# Patient Record
Sex: Male | Born: 1980
Health system: Southern US, Community
[De-identification: ages and names within clinical notes are randomized; demographics above are authoritative.]

## PROBLEM LIST (undated history)

## (undated) HISTORY — PX: HIP SURGERY: SHX245

---

## 2003-03-01 ENCOUNTER — Emergency Department (HOSPITAL_COMMUNITY): Admission: EM | Admit: 2003-03-01 | Discharge: 2003-03-01 | Payer: Self-pay | Admitting: Emergency Medicine

## 2015-03-13 ENCOUNTER — Emergency Department (HOSPITAL_COMMUNITY)
Admission: EM | Admit: 2015-03-13 | Discharge: 2015-03-13 | Disposition: A | Discharge status: Home / Self Care | Payer: BLUE CROSS/BLUE SHIELD | Source: Home / Self Care | Attending: Family Medicine | Admitting: Family Medicine

## 2015-03-13 ENCOUNTER — Emergency Department (HOSPITAL_COMMUNITY)
Admission: EM | Admit: 2015-03-13 | Discharge: 2015-03-13 | Discharge status: Absent without leave | Payer: Self-pay | Source: Home / Self Care

## 2015-03-13 ENCOUNTER — Encounter (HOSPITAL_COMMUNITY): Payer: Self-pay | Admitting: *Deleted

## 2015-03-13 DIAGNOSIS — S81812A Laceration without foreign body, left lower leg, initial encounter: Secondary | ICD-10-CM

## 2015-03-13 NOTE — Discharge Instructions (Signed)
Your cut was repaired using absorbable deep stitches and the superficial stitches that will need to be removed in 14 days. Please wash the area with warm soapy water every day and a plan about appointment. Please remember that the area will be most vulnerable to reinjury from 2-3 weeks. Scar tissue will fully form in that area in 6 weeks. Memory to decrease sun exposure to the area and consider using a vitamin E lotion over the next several months in order to soften the scar. If you develop any signs of infection such as swelling increasing tenderness or discharge please call us immediately. Memory to minimize the amount of tension you apply to that area and minimize your contact over the next 3 weeks.

## 2015-03-13 NOTE — ED Notes (Addendum)
Pt  Sustained  A  laceration    To  His  l  Leg in the  Area  Of the  Knee   Bleeding  Has  Subsided              He  Ambulated  To room  With a  Steady  Fluid  Gait  Pt  States  He  Snagged  A  Metal  Bolt   When he  Was  Climbing a  Ladder    During  training

## 2015-03-13 NOTE — ED Provider Notes (Signed)
CSN: [id number]     Arrival date & time 03/13/15  1015 History   None    Chief Complaint  Patient presents with  . Laceration   (Consider location/radiation/quality/duration/timing/severity/associated sxs/prior Treatment) HPI  L distal leg. Medial aspect. Jumping a 4 foot fence during training for [employer] and leg caugth a metal bolt. Immediately painful. No radiation of pain. Immediately started to bleed and aplied pressure w/ relief. Last tetanus 06/2014. No loss of function or sensation down leg.  Denies headache, lightheadedness, syncope, vomiting, chest pain, palpitations, shortness of breath, LOC, fevers, nausea, vomiting, leg stiffness, leg weakness.  History reviewed. No pertinent past medical history. Past Surgical History  Procedure Laterality Date  . Hip surgery      L labral tear   Family History  Problem Relation Age of Onset  . Hypertension Mother   . Rheum arthritis Mother   . Hypertension Father    History  Substance Use Topics  . Smoking status: Not on file  . Smokeless tobacco: Not on file  . Alcohol Use: No    Review of Systems Per HPI with all other pertinent systems negative.   Allergies  Review of patient's allergies indicates no known allergies.  Home Medications   Prior to Admission medications   Not on File   BP 141/84 mmHg  Pulse 76  Temp(Src) 98.8 F (37.1 C) (Oral)  Resp 16  SpO2 99% Physical Exam  Physical Exam  Constitutional: oriented to person, place, and time. appears well-developed and well-nourished. No distress.  HENT:  Head: Normocephalic and atraumatic.  Eyes: EOMI. PERRL.  Neck: Normal range of motion.  Cardiovascular: RRR, no m/r/g, 2+ distal pulses,  Pulmonary/Chest: Effort normal and breath sounds normal. No respiratory distress.  Abdominal: Soft. Bowel sounds are normal. NonTTP, no distension.  Musculoskeletal: Normal range of motion. Non ttp, no effusion.  Neurological: alert and oriented to person, place, and time.   Skin: Left distal medial thigh with tearing type laceration. Repair as below.  Psychiatric: normal mood and affect. behavior is normal. Judgment and thought content normal.      ED Course  LACERATION REPAIR Date/Time: 03/13/2015 12:44 PM Performed by: Konrad DoloresMERRELL, Devora Tortorella J Authorized by: Konrad DoloresMERRELL, Joakim Huesman J Consent: Verbal consent obtained. Risks and benefits: risks, benefits and alternatives were discussed Consent given by: patient Patient identity confirmed: verbally with patient Location: L distal medial thigh. Laceration length: 1.7 cm Tendon involvement: none Nerve involvement: none Vascular damage: no Anesthesia: local infiltration Local anesthetic: lidocaine 2% with epinephrine Anesthetic total: 4 ml Irrigation solution: Betadine and saline. Amount of cleaning: extensive Debridement: none Degree of undermining: none Skin closure: 3-0 nylon Subcutaneous closure: 3-0 Vicryl Number of sutures: 3 Vicryl and 5 nylon. Technique: simple Approximation: close Approximation difficulty: complex Patient tolerance: Patient tolerated the procedure well with no immediate complications   (including critical care time) Labs Review Labs Reviewed - No data to display  Imaging Review No results found.   MDM   1. Laceration of left leg, initial encounter    Tearing laceration repaired as described above. Patient tolerated the procedure well. Patient young healthy with excellent vascular flow to the area. Area was well cleaned and there is no need for anabolic so this point time. Very detailed wound care instructions were given to the patient. In short patient's sutures will come out in 2 weeks. Patient can return to full activity after 6 weeks and limited activity after 3 weeks in order to assure that the area does not dehisced.  Patient to call if develops any warning signs or symptoms of infection and will need antibiotics at that point time.     Ozella Rocks, MD 03/13/15 1247

## 2018-12-29 DIAGNOSIS — Z0000 Encounter for general adult medical examination without abnormal findings: Secondary | ICD-10-CM | POA: Diagnosis not present

## 2018-12-29 DIAGNOSIS — E7800 Pure hypercholesterolemia, unspecified: Secondary | ICD-10-CM | POA: Diagnosis not present

## 2018-12-29 DIAGNOSIS — Z114 Encounter for screening for human immunodeficiency virus [HIV]: Secondary | ICD-10-CM | POA: Diagnosis not present

## 2018-12-29 DIAGNOSIS — M791 Myalgia, unspecified site: Secondary | ICD-10-CM | POA: Diagnosis not present

## 2018-12-29 DIAGNOSIS — Z23 Encounter for immunization: Secondary | ICD-10-CM | POA: Diagnosis not present

## 2018-12-29 DIAGNOSIS — E559 Vitamin D deficiency, unspecified: Secondary | ICD-10-CM | POA: Diagnosis not present

## 2018-12-29 DIAGNOSIS — R5383 Other fatigue: Secondary | ICD-10-CM | POA: Diagnosis not present

## 2019-01-19 DIAGNOSIS — E559 Vitamin D deficiency, unspecified: Secondary | ICD-10-CM | POA: Diagnosis not present

## 2019-01-19 DIAGNOSIS — E7800 Pure hypercholesterolemia, unspecified: Secondary | ICD-10-CM | POA: Diagnosis not present

## 2019-01-19 DIAGNOSIS — Z711 Person with feared health complaint in whom no diagnosis is made: Secondary | ICD-10-CM | POA: Diagnosis not present

## 2019-03-01 DIAGNOSIS — H9313 Tinnitus, bilateral: Secondary | ICD-10-CM | POA: Diagnosis not present

## 2019-05-27 DIAGNOSIS — H9313 Tinnitus, bilateral: Secondary | ICD-10-CM | POA: Diagnosis not present

## 2019-05-27 DIAGNOSIS — H833X3 Noise effects on inner ear, bilateral: Secondary | ICD-10-CM | POA: Diagnosis not present

## 2019-06-09 DIAGNOSIS — H9313 Tinnitus, bilateral: Secondary | ICD-10-CM | POA: Diagnosis not present

## 2019-09-27 DIAGNOSIS — E7800 Pure hypercholesterolemia, unspecified: Secondary | ICD-10-CM | POA: Diagnosis not present

## 2019-09-27 DIAGNOSIS — Z1159 Encounter for screening for other viral diseases: Secondary | ICD-10-CM | POA: Diagnosis not present

## 2019-09-27 DIAGNOSIS — Z23 Encounter for immunization: Secondary | ICD-10-CM | POA: Diagnosis not present

## 2019-09-27 DIAGNOSIS — E559 Vitamin D deficiency, unspecified: Secondary | ICD-10-CM | POA: Diagnosis not present

## 2019-09-27 DIAGNOSIS — Z79899 Other long term (current) drug therapy: Secondary | ICD-10-CM | POA: Diagnosis not present

## 2020-01-07 DIAGNOSIS — M25572 Pain in left ankle and joints of left foot: Secondary | ICD-10-CM | POA: Diagnosis not present

## 2020-01-07 DIAGNOSIS — S93409A Sprain of unspecified ligament of unspecified ankle, initial encounter: Secondary | ICD-10-CM | POA: Diagnosis not present

## 2020-01-07 DIAGNOSIS — I10 Essential (primary) hypertension: Secondary | ICD-10-CM | POA: Diagnosis not present

## 2020-01-28 DIAGNOSIS — K219 Gastro-esophageal reflux disease without esophagitis: Secondary | ICD-10-CM | POA: Diagnosis not present

## 2020-01-28 DIAGNOSIS — Z0000 Encounter for general adult medical examination without abnormal findings: Secondary | ICD-10-CM | POA: Diagnosis not present

## 2020-01-28 DIAGNOSIS — E7800 Pure hypercholesterolemia, unspecified: Secondary | ICD-10-CM | POA: Diagnosis not present

## 2020-01-28 DIAGNOSIS — R079 Chest pain, unspecified: Secondary | ICD-10-CM | POA: Diagnosis not present

## 2020-01-28 DIAGNOSIS — R030 Elevated blood-pressure reading, without diagnosis of hypertension: Secondary | ICD-10-CM | POA: Diagnosis not present

## 2020-03-09 DIAGNOSIS — I10 Essential (primary) hypertension: Secondary | ICD-10-CM | POA: Diagnosis not present

## 2020-03-09 DIAGNOSIS — Z8249 Family history of ischemic heart disease and other diseases of the circulatory system: Secondary | ICD-10-CM | POA: Diagnosis not present

## 2020-03-09 DIAGNOSIS — R030 Elevated blood-pressure reading, without diagnosis of hypertension: Secondary | ICD-10-CM | POA: Diagnosis not present

## 2020-03-20 DIAGNOSIS — I10 Essential (primary) hypertension: Secondary | ICD-10-CM | POA: Diagnosis not present

## 2020-08-01 DIAGNOSIS — E7800 Pure hypercholesterolemia, unspecified: Secondary | ICD-10-CM | POA: Diagnosis not present

## 2020-08-01 DIAGNOSIS — I10 Essential (primary) hypertension: Secondary | ICD-10-CM | POA: Diagnosis not present

## 2020-12-06 DIAGNOSIS — Z03818 Encounter for observation for suspected exposure to other biological agents ruled out: Secondary | ICD-10-CM | POA: Diagnosis not present

## 2020-12-06 DIAGNOSIS — Z20822 Contact with and (suspected) exposure to covid-19: Secondary | ICD-10-CM | POA: Diagnosis not present

## 2020-12-21 DIAGNOSIS — R0683 Snoring: Secondary | ICD-10-CM | POA: Diagnosis not present

## 2020-12-21 DIAGNOSIS — I10 Essential (primary) hypertension: Secondary | ICD-10-CM | POA: Diagnosis not present

## 2020-12-21 DIAGNOSIS — M79604 Pain in right leg: Secondary | ICD-10-CM | POA: Diagnosis not present

## 2021-01-01 DIAGNOSIS — G4719 Other hypersomnia: Secondary | ICD-10-CM | POA: Diagnosis not present

## 2021-01-01 DIAGNOSIS — E7800 Pure hypercholesterolemia, unspecified: Secondary | ICD-10-CM | POA: Diagnosis not present

## 2021-01-01 DIAGNOSIS — I10 Essential (primary) hypertension: Secondary | ICD-10-CM | POA: Diagnosis not present

## 2021-01-01 DIAGNOSIS — G4721 Circadian rhythm sleep disorder, delayed sleep phase type: Secondary | ICD-10-CM | POA: Diagnosis not present

## 2021-01-05 DIAGNOSIS — M79604 Pain in right leg: Secondary | ICD-10-CM | POA: Diagnosis not present

## 2021-01-12 DIAGNOSIS — G4733 Obstructive sleep apnea (adult) (pediatric): Secondary | ICD-10-CM | POA: Diagnosis not present

## 2021-01-12 DIAGNOSIS — E669 Obesity, unspecified: Secondary | ICD-10-CM | POA: Diagnosis not present

## 2021-01-12 DIAGNOSIS — I10 Essential (primary) hypertension: Secondary | ICD-10-CM | POA: Diagnosis not present

## 2021-01-30 DIAGNOSIS — G4733 Obstructive sleep apnea (adult) (pediatric): Secondary | ICD-10-CM | POA: Diagnosis not present

## 2021-01-31 DIAGNOSIS — Z0000 Encounter for general adult medical examination without abnormal findings: Secondary | ICD-10-CM | POA: Diagnosis not present

## 2021-01-31 DIAGNOSIS — E7800 Pure hypercholesterolemia, unspecified: Secondary | ICD-10-CM | POA: Diagnosis not present

## 2021-01-31 DIAGNOSIS — I10 Essential (primary) hypertension: Secondary | ICD-10-CM | POA: Diagnosis not present

## 2021-02-01 ENCOUNTER — Other Ambulatory Visit (HOSPITAL_COMMUNITY): Payer: Self-pay | Admitting: Family Medicine

## 2021-02-01 ENCOUNTER — Other Ambulatory Visit: Payer: Self-pay | Admitting: Family Medicine

## 2021-02-01 DIAGNOSIS — Z8249 Family history of ischemic heart disease and other diseases of the circulatory system: Secondary | ICD-10-CM

## 2021-02-02 DIAGNOSIS — M79604 Pain in right leg: Secondary | ICD-10-CM | POA: Diagnosis not present

## 2021-02-06 ENCOUNTER — Other Ambulatory Visit: Payer: Self-pay | Admitting: Family Medicine

## 2021-02-06 DIAGNOSIS — Z8249 Family history of ischemic heart disease and other diseases of the circulatory system: Secondary | ICD-10-CM

## 2021-02-06 DIAGNOSIS — M79604 Pain in right leg: Secondary | ICD-10-CM

## 2021-02-07 ENCOUNTER — Other Ambulatory Visit: Payer: Self-pay

## 2021-02-07 ENCOUNTER — Ambulatory Visit (HOSPITAL_COMMUNITY)
Admission: RE | Admit: 2021-02-07 | Discharge: 2021-02-07 | Disposition: A | Discharge status: Home / Self Care | Payer: BLUE CROSS/BLUE SHIELD | Source: Ambulatory Visit | Attending: Family Medicine | Admitting: Family Medicine

## 2021-02-07 DIAGNOSIS — Z8249 Family history of ischemic heart disease and other diseases of the circulatory system: Secondary | ICD-10-CM | POA: Insufficient documentation

## 2021-02-08 ENCOUNTER — Ambulatory Visit
Admission: RE | Admit: 2021-02-08 | Discharge: 2021-02-08 | Disposition: A | Discharge status: Home / Self Care | Payer: BC Managed Care – PPO | Source: Ambulatory Visit | Attending: Family Medicine | Admitting: Family Medicine

## 2021-02-08 DIAGNOSIS — Z8249 Family history of ischemic heart disease and other diseases of the circulatory system: Secondary | ICD-10-CM

## 2021-02-08 DIAGNOSIS — M79604 Pain in right leg: Secondary | ICD-10-CM

## 2021-02-19 DIAGNOSIS — M25561 Pain in right knee: Secondary | ICD-10-CM | POA: Diagnosis not present

## 2021-02-20 DIAGNOSIS — G4733 Obstructive sleep apnea (adult) (pediatric): Secondary | ICD-10-CM | POA: Diagnosis not present

## 2021-03-23 DIAGNOSIS — G4733 Obstructive sleep apnea (adult) (pediatric): Secondary | ICD-10-CM | POA: Diagnosis not present

## 2021-04-10 DIAGNOSIS — G4733 Obstructive sleep apnea (adult) (pediatric): Secondary | ICD-10-CM | POA: Diagnosis not present

## 2021-04-10 DIAGNOSIS — F5112 Insufficient sleep syndrome: Secondary | ICD-10-CM | POA: Diagnosis not present

## 2021-04-10 DIAGNOSIS — I10 Essential (primary) hypertension: Secondary | ICD-10-CM | POA: Diagnosis not present

## 2021-04-22 DIAGNOSIS — G4733 Obstructive sleep apnea (adult) (pediatric): Secondary | ICD-10-CM | POA: Diagnosis not present

## 2021-05-01 DIAGNOSIS — G4733 Obstructive sleep apnea (adult) (pediatric): Secondary | ICD-10-CM | POA: Diagnosis not present

## 2021-05-23 DIAGNOSIS — G4733 Obstructive sleep apnea (adult) (pediatric): Secondary | ICD-10-CM | POA: Diagnosis not present

## 2022-09-28 IMAGING — CT CT CARDIAC CORONARY ARTERY CALCIUM SCORE
2 series · 16 of 20 positions shown, 18 images · non-contrast
Comparison: None.
COMPARISON: None.

Addendum:
EXAM:
OVER-READ INTERPRETATION  CT CHEST

The following report is an over-read performed by radiologist Dr.
Romanas Kirda [REDACTED] on 02/07/2021. This
over-read does not include interpretation of cardiac or coronary
anatomy or pathology. The coronary calcium score interpretation by
the cardiologist is attached.
CLINICAL DATA: Risk stratification: 39 Year-old White Male
Coronary Calcium Score
TECHNIQUE: The patient was scanned on a Siemens Force scanner. Axial
non-contrast 3 mm slices were carried out through the heart. The
data set was analyzed on a dedicated work station and scored using
the Agatson method.

[Series 3: cascseq 2.0 b35f 70% · axial · 0.39mm/px · z∈[+1205,+1311]mm · 8 of 69 slices shown]
[im 8/69  vessel]
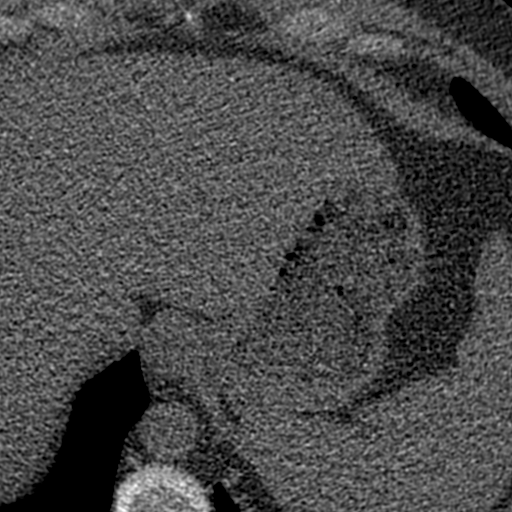
[im 16/69  vessel]
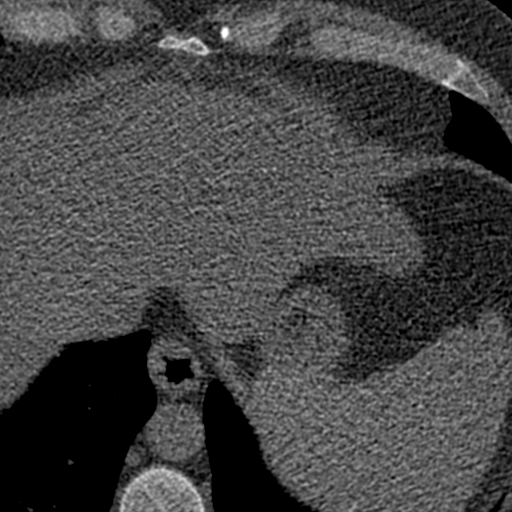
[im 23/69  vessel]
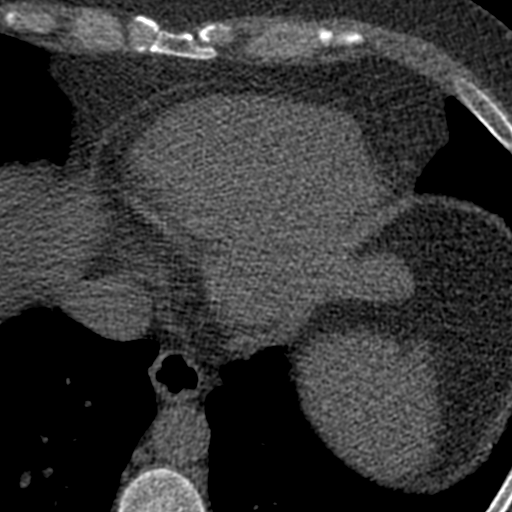
[im 31/69  vessel]
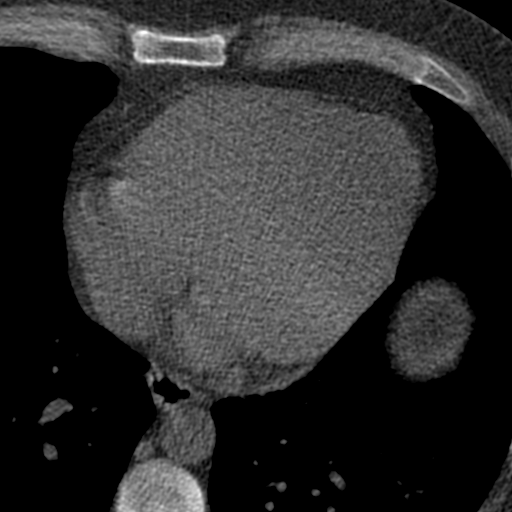
[im 38/69  vessel]
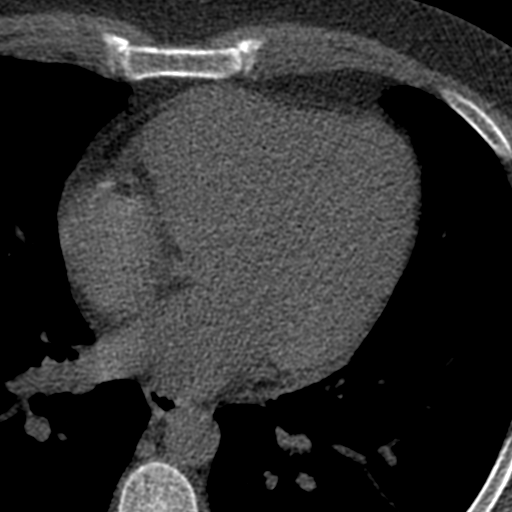
[im 46/69  vessel]
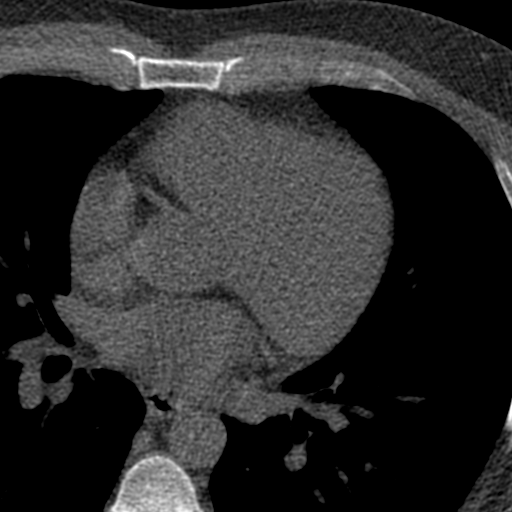
[im 53/69  vessel]
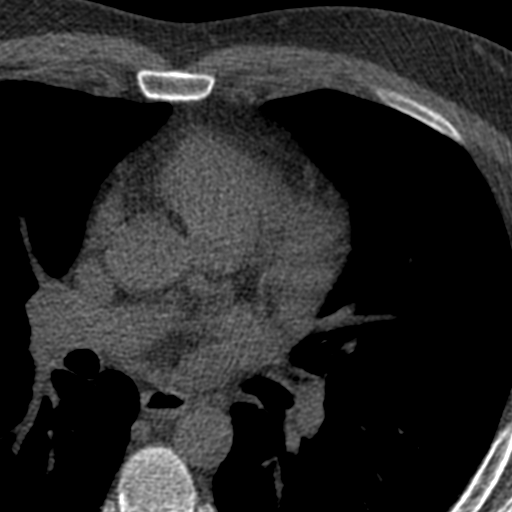
[im 61/69  vessel]
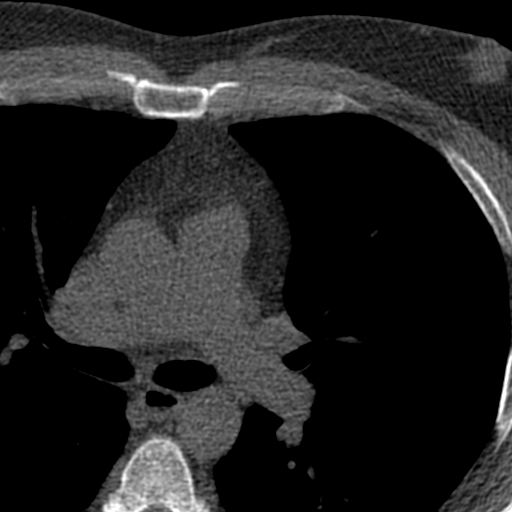

[Series 4: ax st full fov · axial · 0.87mm/px · z∈[+1205,+1311]mm · 8 of 69 slices shown, 10 images]
[im 8/69  vessel]
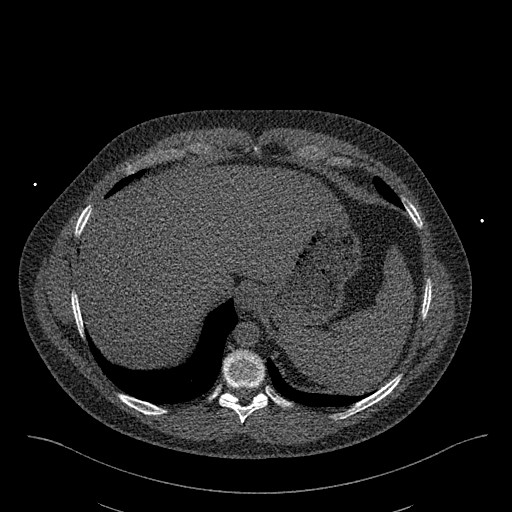
[im 8/69  lung]
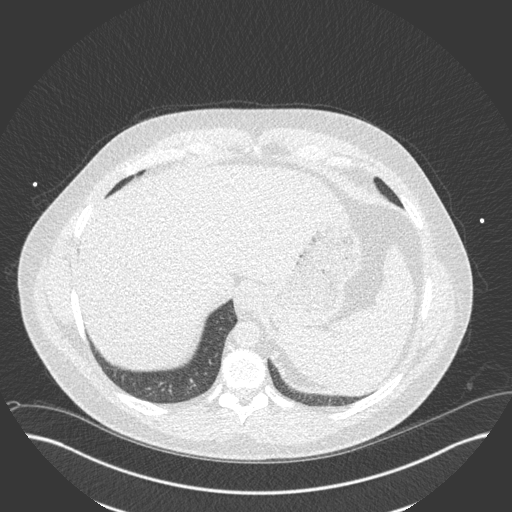
[im 16/69  vessel]
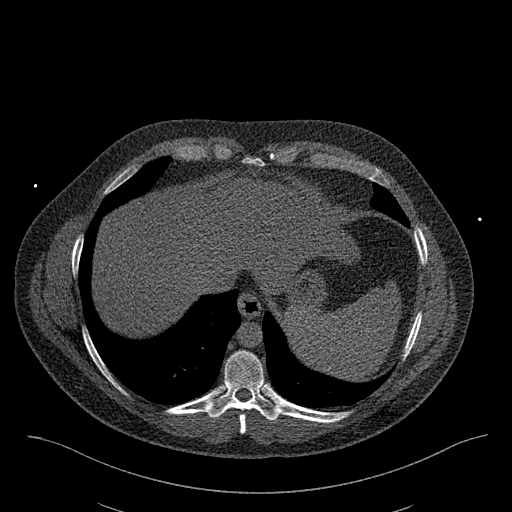
[im 23/69  vessel]
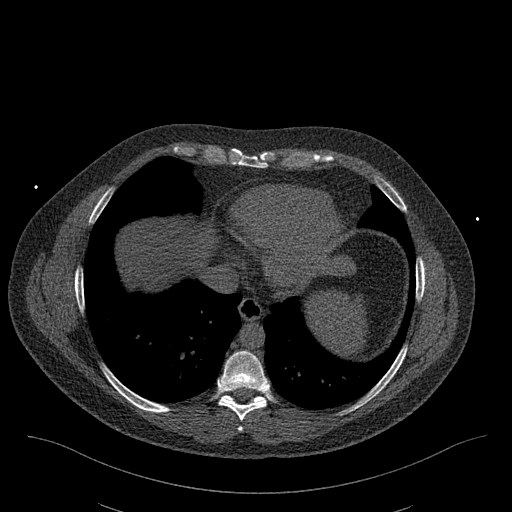
[im 31/69  vessel]
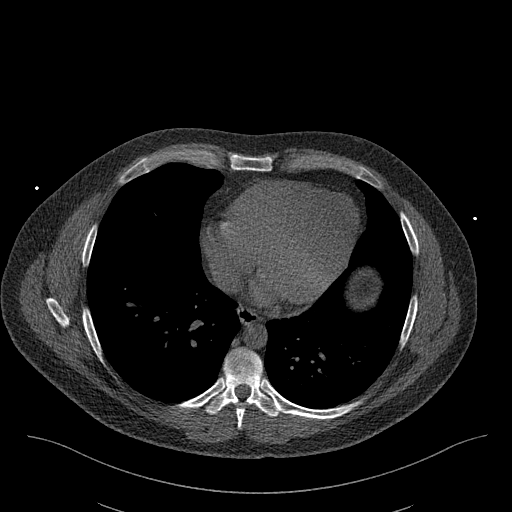
[im 38/69  vessel]
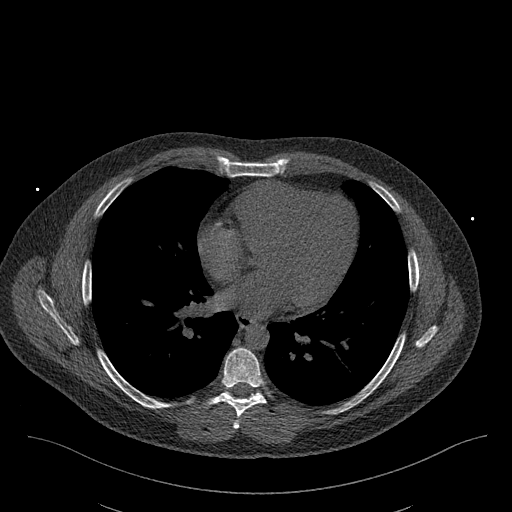
[im 38/69  lung]
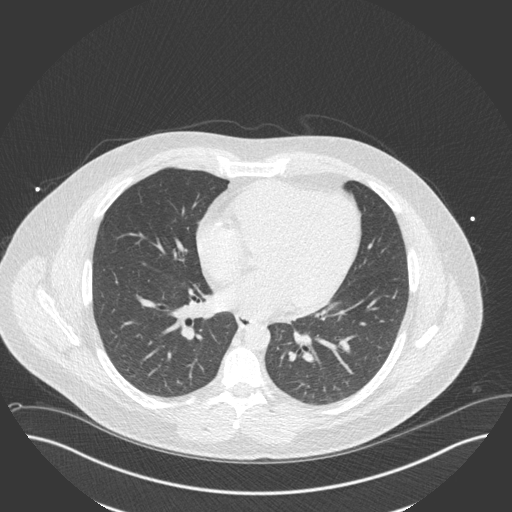
[im 46/69  vessel]
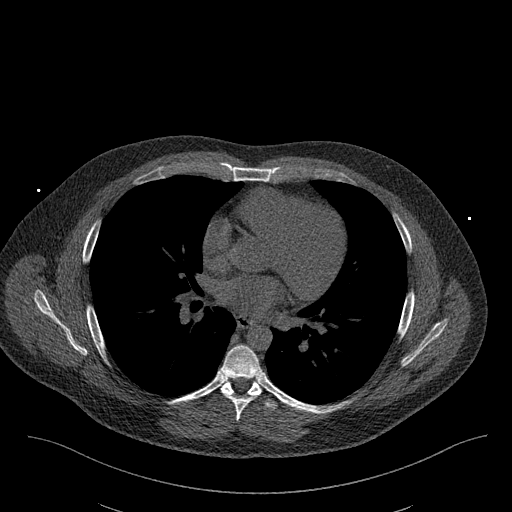
[im 53/69  vessel]
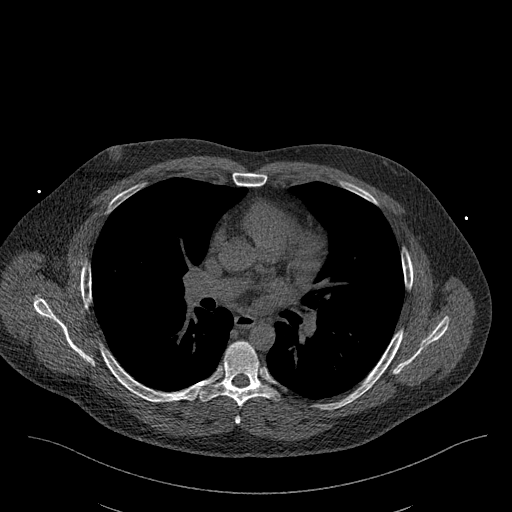
[im 61/69  vessel]
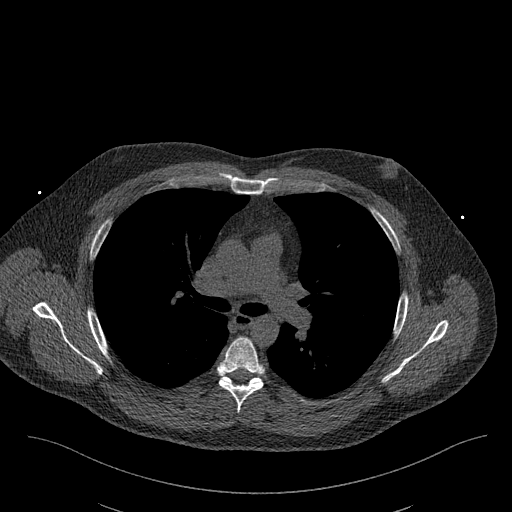

[16 of 20 positions shown; findings below may reference images not displayed]

FINDINGS: Within the visualized portions of the thorax there are no suspicious
appearing pulmonary nodules or masses, there is no acute
consolidative airspace disease, no pleural effusions, no
pneumothorax and no lymphadenopathy. Visualized portions of the
upper abdomen demonstrates mild diffuse low attenuation throughout
the visualized hepatic parenchyma. There are no aggressive appearing
lytic or blastic lesions noted in the visualized portions of the
skeleton.
IMPRESSION: 1. Hepatic steatosis.
FINDINGS: Non-cardiac: See separate report from [REDACTED].

Ascending Aorta: Normal caliber.

Pericardium: Normal.

Coronary arteries: Normal origins.

Coronary calcium score of 0. This was 1st percentile for age,
gender, and race matched controls.
IMPRESSION: 1. Coronary calcium score of 0. This was 1st percentile for age,
gender, and race matched controls.

*** End of Addendum ***
EXAM:
OVER-READ INTERPRETATION  CT CHEST

The following report is an over-read performed by radiologist Dr.
Romanas Kirda [REDACTED] on 02/07/2021. This
over-read does not include interpretation of cardiac or coronary
anatomy or pathology. The coronary calcium score interpretation by
the cardiologist is attached.
FINDINGS: Within the visualized portions of the thorax there are no suspicious
appearing pulmonary nodules or masses, there is no acute
consolidative airspace disease, no pleural effusions, no
pneumothorax and no lymphadenopathy. Visualized portions of the
upper abdomen demonstrates mild diffuse low attenuation throughout
the visualized hepatic parenchyma. There are no aggressive appearing
lytic or blastic lesions noted in the visualized portions of the
skeleton.
IMPRESSION: 1. Hepatic steatosis.
